# Patient Record
Sex: Female | Born: 1940 | Race: White | Hispanic: No | Marital: Married | Smoking: Never smoker
Health system: Southern US, Community
[De-identification: ages and names within clinical notes are randomized; demographics above are authoritative.]

## PROBLEM LIST (undated history)

## (undated) DIAGNOSIS — E039 Hypothyroidism, unspecified: Secondary | ICD-10-CM

## (undated) DIAGNOSIS — D3001 Benign neoplasm of right kidney: Secondary | ICD-10-CM

## (undated) HISTORY — PX: ABDOMINAL HYSTERECTOMY: SHX81

---

## 2004-10-19 ENCOUNTER — Ambulatory Visit: Payer: Self-pay | Admitting: Internal Medicine

## 2005-01-16 ENCOUNTER — Ambulatory Visit: Payer: Self-pay | Admitting: Internal Medicine

## 2005-01-22 ENCOUNTER — Ambulatory Visit: Payer: Self-pay | Admitting: Internal Medicine

## 2005-02-07 ENCOUNTER — Ambulatory Visit: Payer: Self-pay | Admitting: Internal Medicine

## 2006-01-10 ENCOUNTER — Ambulatory Visit: Payer: Self-pay | Admitting: Internal Medicine

## 2006-01-11 ENCOUNTER — Ambulatory Visit: Payer: Self-pay | Admitting: Internal Medicine

## 2006-03-12 ENCOUNTER — Ambulatory Visit: Payer: Self-pay | Admitting: Internal Medicine

## 2006-06-21 ENCOUNTER — Ambulatory Visit: Payer: Self-pay | Admitting: Internal Medicine

## 2006-07-05 ENCOUNTER — Ambulatory Visit: Payer: Self-pay | Admitting: Internal Medicine

## 2006-09-18 ENCOUNTER — Ambulatory Visit: Payer: Self-pay | Admitting: Internal Medicine

## 2007-01-29 ENCOUNTER — Ambulatory Visit: Payer: Self-pay | Admitting: Internal Medicine

## 2007-03-27 ENCOUNTER — Ambulatory Visit: Payer: Self-pay | Admitting: Internal Medicine

## 2007-03-27 LAB — CONVERTED CEMR LAB
ALT: 21 units/L (ref 0–35)
AST: 24 units/L (ref 0–37)
Bacteria, UA: NEGATIVE
Basophils Relative: 1 % (ref 0.0–1.0)
CO2: 28 meq/L (ref 19–32)
Chloride: 107 meq/L (ref 96–112)
Creatinine, Ser: 0.7 mg/dL (ref 0.4–1.2)
Glucose, Bld: 93 mg/dL (ref 70–99)
HCT: 39.8 % (ref 36.0–46.0)
HDL: 55.6 mg/dL (ref >39.0)
Hemoglobin: 13.8 g/dL (ref 12.0–15.0)
MCHC: 34.7 g/dL (ref 30.0–36.0)
Monocytes Absolute: 0.6 10*3/uL (ref 0.2–0.7)
Nitrite: NEGATIVE
RBC: 4.6 M/uL (ref 3.87–5.11)
RDW: 12.7 % (ref 11.5–14.6)
Sodium: 144 meq/L (ref 135–145)
TSH: 5.34 microintl units/mL (ref 0.35–5.50)
Total Protein, Urine: NEGATIVE mg/dL
Total Protein: 6.7 g/dL (ref 6.0–8.3)
Urobilinogen, UA: 0.2 (ref 0.0–1.0)
VLDL: 24 mg/dL (ref 0–40)
WBC: 6.6 10*3/uL (ref 4.5–10.5)

## 2007-06-17 ENCOUNTER — Telehealth (INDEPENDENT_AMBULATORY_CARE_PROVIDER_SITE_OTHER): Payer: Self-pay | Admitting: *Deleted

## 2007-06-18 ENCOUNTER — Ambulatory Visit: Payer: Self-pay | Admitting: Internal Medicine

## 2007-06-18 LAB — CONVERTED CEMR LAB
Basophils Absolute: 0 10*3/uL (ref 0.0–0.1)
Chloride: 102 meq/L (ref 96–112)
Cholesterol: 183 mg/dL (ref 0–200)
Eosinophils Absolute: 0.2 10*3/uL (ref 0.0–0.6)
GFR calc Af Amer: 128 mL/min
Glucose, Bld: 97 mg/dL (ref 70–99)
HCT: 42.3 % (ref 36.0–46.0)
HDL: 56.4 mg/dL (ref >39.0)
Hemoglobin: 14.5 g/dL (ref 12.0–15.0)
MCHC: 34.1 g/dL (ref 30.0–36.0)
Monocytes Absolute: 0.7 10*3/uL (ref 0.2–0.7)
Neutro Abs: 4.4 10*3/uL (ref 1.4–7.7)
Neutrophils Relative %: 61.2 % (ref 43.0–77.0)
RDW: 12.7 % (ref 11.5–14.6)
Triglycerides: 113 mg/dL (ref 0–149)

## 2007-07-02 ENCOUNTER — Telehealth: Payer: Self-pay | Admitting: Internal Medicine

## 2007-07-31 ENCOUNTER — Encounter: Payer: Self-pay | Admitting: Internal Medicine

## 2007-08-21 DIAGNOSIS — D3001 Benign neoplasm of right kidney: Secondary | ICD-10-CM

## 2007-08-21 HISTORY — DX: Benign neoplasm of right kidney: D30.01

## 2007-09-02 ENCOUNTER — Ambulatory Visit: Payer: Self-pay | Admitting: Internal Medicine

## 2007-09-02 DIAGNOSIS — M899 Disorder of bone, unspecified: Secondary | ICD-10-CM | POA: Insufficient documentation

## 2007-09-02 DIAGNOSIS — R079 Chest pain, unspecified: Secondary | ICD-10-CM

## 2007-09-02 DIAGNOSIS — J069 Acute upper respiratory infection, unspecified: Secondary | ICD-10-CM | POA: Insufficient documentation

## 2007-09-02 DIAGNOSIS — E785 Hyperlipidemia, unspecified: Secondary | ICD-10-CM

## 2007-09-02 DIAGNOSIS — M949 Disorder of cartilage, unspecified: Secondary | ICD-10-CM

## 2007-09-08 ENCOUNTER — Ambulatory Visit: Payer: Self-pay

## 2007-09-16 ENCOUNTER — Encounter: Payer: Self-pay | Admitting: Internal Medicine

## 2007-10-09 ENCOUNTER — Ambulatory Visit: Payer: Self-pay | Admitting: Internal Medicine

## 2007-10-09 DIAGNOSIS — J309 Allergic rhinitis, unspecified: Secondary | ICD-10-CM | POA: Insufficient documentation

## 2007-10-09 DIAGNOSIS — K219 Gastro-esophageal reflux disease without esophagitis: Secondary | ICD-10-CM

## 2007-12-09 ENCOUNTER — Ambulatory Visit: Payer: Self-pay | Admitting: Internal Medicine

## 2008-01-05 ENCOUNTER — Ambulatory Visit: Payer: Self-pay | Admitting: Internal Medicine

## 2008-01-05 LAB — CONVERTED CEMR LAB
AST: 24 units/L (ref 0–37)
Albumin: 3.9 g/dL (ref 3.5–5.2)
Alkaline Phosphatase: 95 units/L (ref 39–117)
Chloride: 106 meq/L (ref 96–112)
Cholesterol: 179 mg/dL (ref 0–200)
Creatinine, Ser: 0.7 mg/dL (ref 0.4–1.2)
GFR calc Af Amer: 107 mL/min
GFR calc non Af Amer: 88 mL/min
Glucose, Bld: 95 mg/dL (ref 70–99)
LDL Cholesterol: 108 mg/dL — ABNORMAL HIGH (ref 0–99)
Potassium: 4 meq/L (ref 3.5–5.1)
Total Bilirubin: 0.7 mg/dL (ref 0.3–1.2)
Total Protein: 6.9 g/dL (ref 6.0–8.3)
Triglycerides: 118 mg/dL (ref 0–149)
VLDL: 24 mg/dL (ref 0–40)

## 2008-01-13 ENCOUNTER — Encounter (INDEPENDENT_AMBULATORY_CARE_PROVIDER_SITE_OTHER): Payer: Self-pay | Admitting: *Deleted

## 2008-01-13 ENCOUNTER — Ambulatory Visit: Payer: Self-pay | Admitting: Internal Medicine

## 2008-01-13 DIAGNOSIS — R05 Cough: Secondary | ICD-10-CM

## 2008-01-13 DIAGNOSIS — R110 Nausea: Secondary | ICD-10-CM

## 2008-01-13 DIAGNOSIS — B351 Tinea unguium: Secondary | ICD-10-CM | POA: Insufficient documentation

## 2008-02-04 ENCOUNTER — Ambulatory Visit: Payer: Self-pay | Admitting: Internal Medicine

## 2008-02-04 DIAGNOSIS — R1031 Right lower quadrant pain: Secondary | ICD-10-CM

## 2008-02-04 DIAGNOSIS — R1011 Right upper quadrant pain: Secondary | ICD-10-CM

## 2008-02-04 LAB — CONVERTED CEMR LAB
Bacteria, UA: NEGATIVE
Ketones, ur: NEGATIVE mg/dL
Leukocytes, UA: NEGATIVE
RBC / HPF: NONE SEEN
pH: 5.5 (ref 5.0–8.0)

## 2008-02-10 ENCOUNTER — Ambulatory Visit (HOSPITAL_COMMUNITY): Admission: RE | Admit: 2008-02-10 | Discharge: 2008-02-10 | Payer: Self-pay | Admitting: Internal Medicine

## 2008-02-12 ENCOUNTER — Encounter: Payer: Self-pay | Admitting: Internal Medicine

## 2008-02-12 ENCOUNTER — Ambulatory Visit: Payer: Self-pay | Admitting: Internal Medicine

## 2008-02-13 ENCOUNTER — Encounter: Payer: Self-pay | Admitting: Internal Medicine

## 2008-03-01 ENCOUNTER — Encounter: Payer: Self-pay | Admitting: Internal Medicine

## 2008-03-09 ENCOUNTER — Encounter: Payer: Self-pay | Admitting: Internal Medicine

## 2008-03-10 ENCOUNTER — Telehealth: Payer: Self-pay | Admitting: Internal Medicine

## 2008-03-17 ENCOUNTER — Encounter: Admission: RE | Admit: 2008-03-17 | Discharge: 2008-03-17 | Payer: Self-pay | Admitting: Urology

## 2008-03-22 ENCOUNTER — Ambulatory Visit (HOSPITAL_COMMUNITY): Admission: RE | Admit: 2008-03-22 | Discharge: 2008-03-23 | Payer: Self-pay | Admitting: Urology

## 2008-03-23 ENCOUNTER — Encounter (INDEPENDENT_AMBULATORY_CARE_PROVIDER_SITE_OTHER): Payer: Self-pay | Admitting: Interventional Radiology

## 2008-03-23 ENCOUNTER — Ambulatory Visit: Payer: Self-pay | Admitting: Vascular Surgery

## 2008-04-01 ENCOUNTER — Encounter: Payer: Self-pay | Admitting: Internal Medicine

## 2008-04-07 ENCOUNTER — Encounter: Admission: RE | Admit: 2008-04-07 | Discharge: 2008-04-07 | Payer: Self-pay | Admitting: Interventional Radiology

## 2008-07-14 ENCOUNTER — Encounter: Admission: RE | Admit: 2008-07-14 | Discharge: 2008-07-14 | Payer: Self-pay | Admitting: Interventional Radiology

## 2009-04-06 ENCOUNTER — Encounter: Admission: RE | Admit: 2009-04-06 | Discharge: 2009-04-06 | Payer: Self-pay | Admitting: Urology

## 2009-05-20 IMAGING — CR DG CHEST 2V
2 series · 2 of 2 positions shown · non-contrast
Comparison: Chest of 03/12/06.

CLINICAL DATA: Cough for several weeks.  
 CHEST - 2 VIEW:

[view not recorded (1 of 2)]
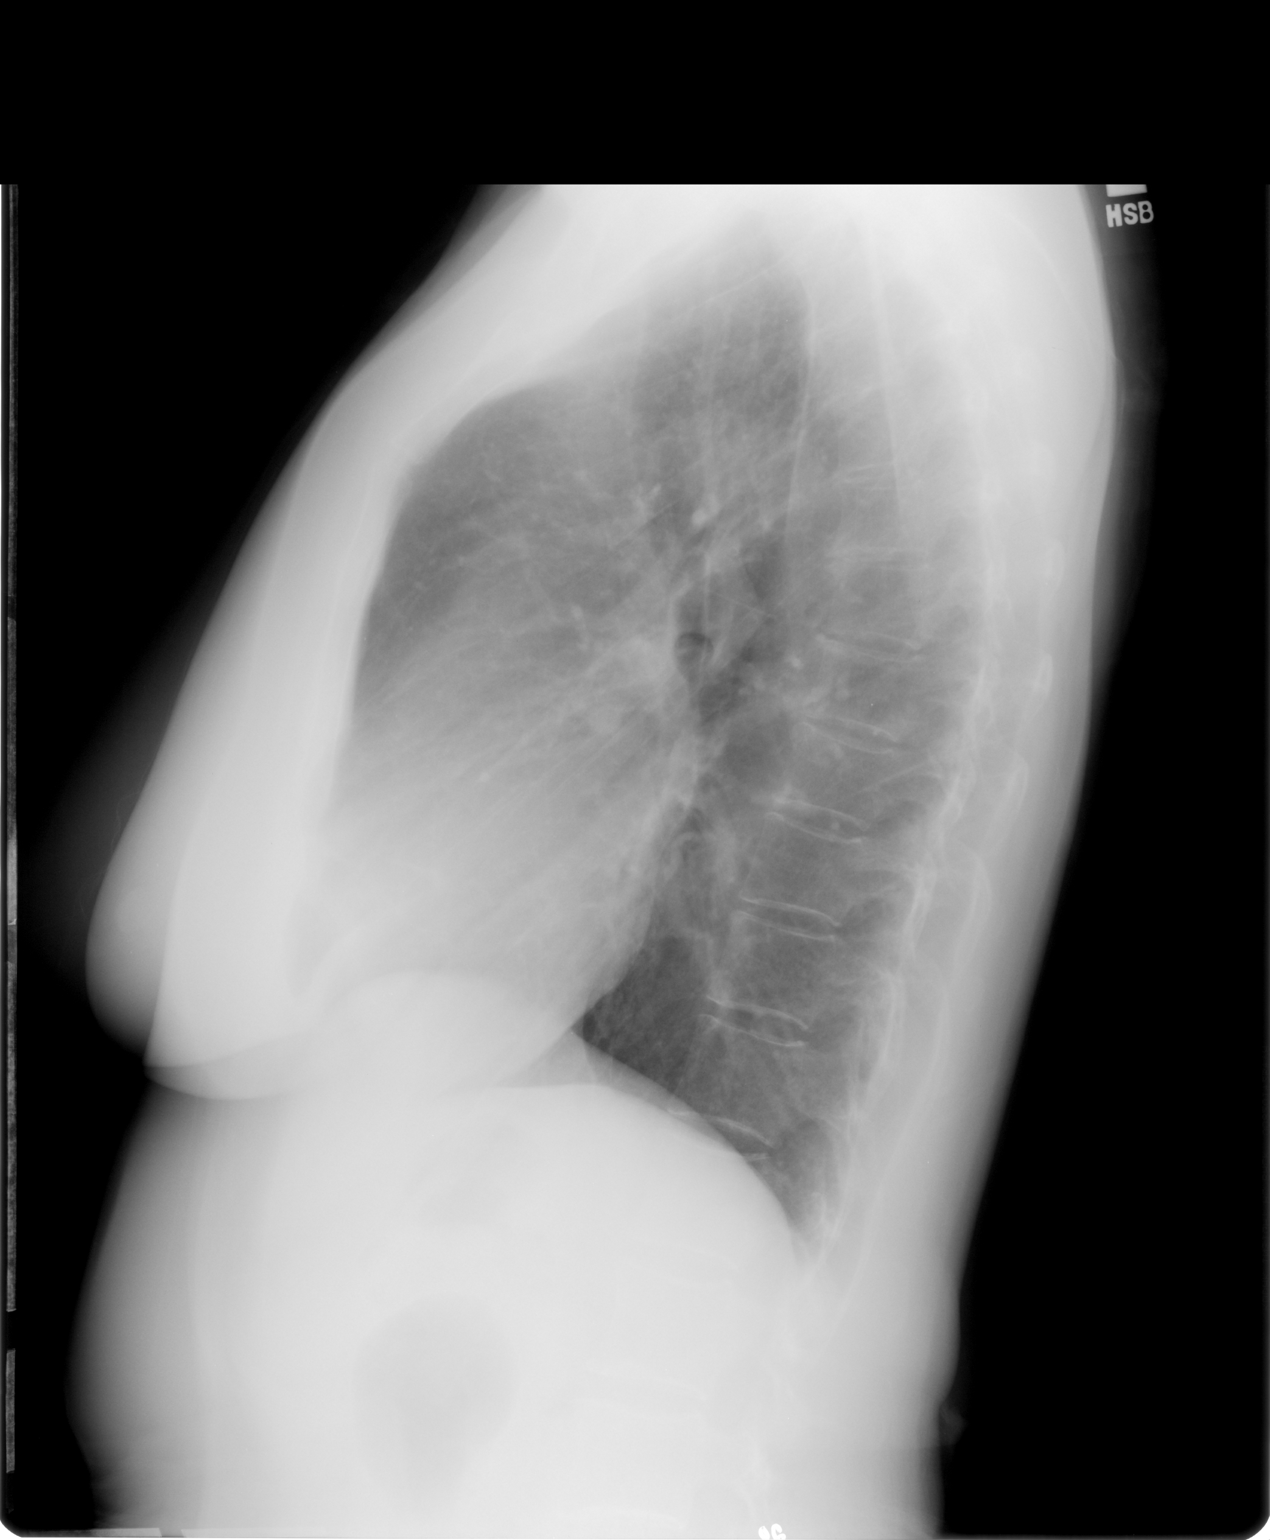

[view not recorded (2 of 2)]
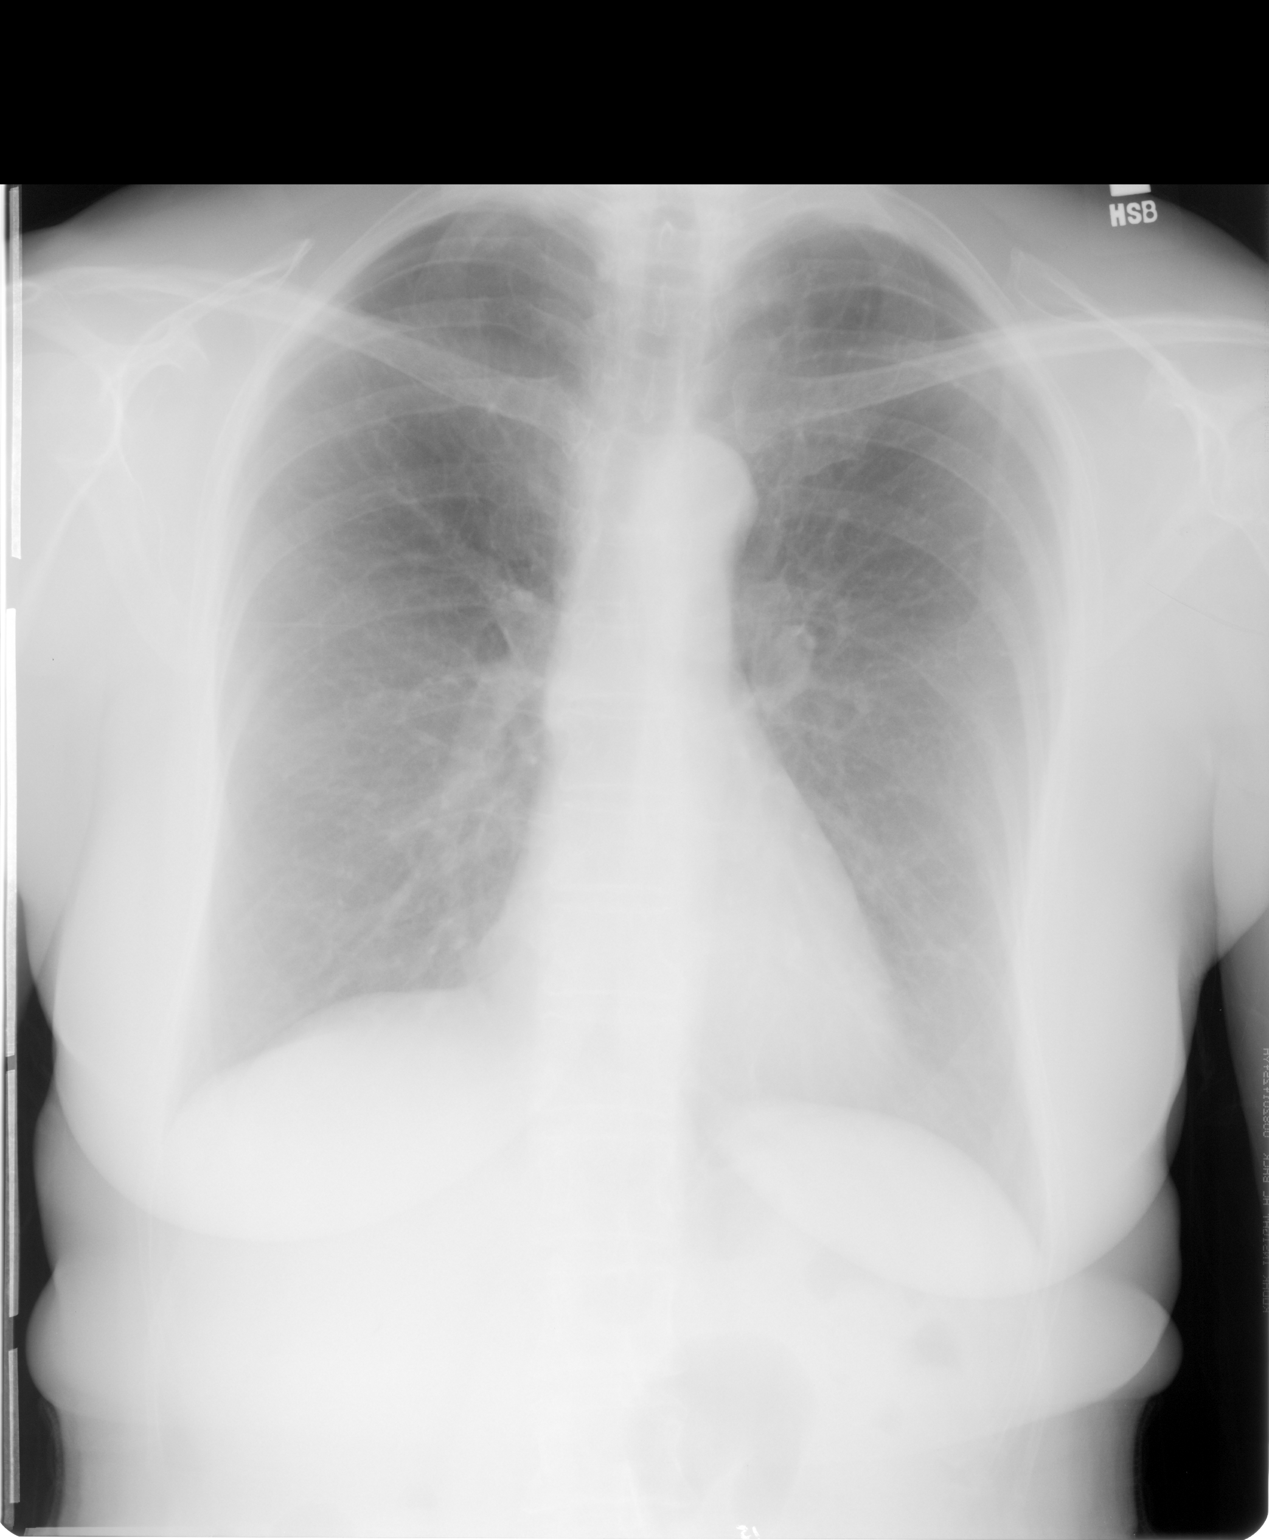

[2 of 2 positions shown; findings below may reference images not displayed]

FINDINGS: Two views of the chest show the lungs to be clear but hyperaerated.  No infiltrate or effusion is seen.  The heart is within normal limits in size.  No bony abnormality is seen.
IMPRESSION: No change in hyperaeration.  No active lung disease.

## 2010-11-30 ENCOUNTER — Other Ambulatory Visit: Payer: Self-pay | Admitting: Interventional Radiology

## 2010-11-30 DIAGNOSIS — D1771 Benign lipomatous neoplasm of kidney: Secondary | ICD-10-CM

## 2011-05-18 LAB — BASIC METABOLIC PANEL
CO2: 29
Calcium: 9
Creatinine, Ser: 0.67
GFR calc Af Amer: >60
GFR calc Af Amer: >60
GFR calc non Af Amer: >60
GFR calc non Af Amer: >60
Glucose, Bld: 122 — ABNORMAL HIGH
Potassium: 3.8
Potassium: 4.1

## 2011-05-18 LAB — CBC
HCT: 37.5
HCT: 38.9
MCV: 87.4
MCV: 87.6
Platelets: 210
Platelets: 230
RBC: 4.45
WBC: 10.4
WBC: 5.5

## 2011-05-18 LAB — PROTIME-INR: INR: 0.9

## 2011-12-04 ENCOUNTER — Other Ambulatory Visit: Payer: Self-pay | Admitting: Interventional Radiology

## 2011-12-04 DIAGNOSIS — N2889 Other specified disorders of kidney and ureter: Secondary | ICD-10-CM

## 2012-01-22 ENCOUNTER — Ambulatory Visit
Admission: RE | Admit: 2012-01-22 | Discharge: 2012-01-22 | Disposition: A | Discharge status: Home / Self Care | Payer: Medicare PPO | Source: Ambulatory Visit | Attending: Interventional Radiology | Admitting: Interventional Radiology

## 2012-01-22 ENCOUNTER — Ambulatory Visit (HOSPITAL_COMMUNITY)
Admission: RE | Admit: 2012-01-22 | Discharge: 2012-01-22 | Disposition: A | Discharge status: Home / Self Care | Payer: Medicare PPO | Source: Ambulatory Visit | Attending: Interventional Radiology | Admitting: Interventional Radiology

## 2012-01-22 DIAGNOSIS — K7689 Other specified diseases of liver: Secondary | ICD-10-CM | POA: Insufficient documentation

## 2012-01-22 DIAGNOSIS — D3000 Benign neoplasm of unspecified kidney: Secondary | ICD-10-CM | POA: Insufficient documentation

## 2012-01-22 DIAGNOSIS — K449 Diaphragmatic hernia without obstruction or gangrene: Secondary | ICD-10-CM | POA: Insufficient documentation

## 2012-01-22 DIAGNOSIS — N2889 Other specified disorders of kidney and ureter: Secondary | ICD-10-CM

## 2012-01-22 HISTORY — DX: Benign neoplasm of right kidney: D30.01

## 2012-01-22 HISTORY — DX: Hypothyroidism, unspecified: E03.9

## 2012-01-22 MED ORDER — IOHEXOL 300 MG/ML  SOLN
100.0000 mL | Freq: Once | INTRAMUSCULAR | Status: AC | PRN
  Administered 2012-01-22: 100 mL via INTRAVENOUS

## 2012-01-24 NOTE — Consult Note (Addendum)
January 22, 2012  Barron Alvine, MD  [address] [place], [place] [address]  Re:  Jean Hill (DOB: 12/11/1940)  Dear Dr. 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  She is doing well.  She is having no flank pain or urinary symptoms.  No recent hospitalizations or significant changes in her medical status.  A CTA obtained today, reported separately, documents continued decrease in size of the upper pole right renal angiomyolipoma, now 2.2 x 3.7 cm maximum axial dimensions (previously 3.3 x 4.8 cm).  There is no new enhancing component or other evidence of tumor progression.  No new renal lesions are identified.  The remainder of the CT is unremarkable.  On exam, she remains of normal mood and affect.  She is afebrile.  Pulse 93, respirations 16, blood pressure 148/64.  O2 sats 97% on room air.  My impression is that she continues to do well after coil embolization of the renal angiomyolipoma.  It has decreased in size, as anticipated, and there is no evidence of enhancing or progressive component or any new lesion.  I think it would be appropriate to space out her surveillance CTs to every 2-3 years at this point.  These can either be done locally or in [place] where she currently resides.  I am happy to see her back in follow-up after these scans or should she have any interval questions or problems with regards to the AML embolization.   Sincerely,    Oley Balm, MD  [facility]

## 2015-02-02 ENCOUNTER — Telehealth: Payer: Self-pay | Admitting: Radiology

## 2015-02-02 ENCOUNTER — Encounter: Payer: Self-pay | Admitting: Radiology

## 2015-02-02 NOTE — Telephone Encounter (Signed)
Unable to leave message:  Voice mail not set up for [phone].  Voice mail full for [phone].   Will send letter to last known address.    Mabel Unrein Riki Rusk, RN 02/02/2015 11:53 AM

## 2015-11-30 ENCOUNTER — Encounter: Payer: Self-pay | Admitting: Internal Medicine
